# Patient Record
Sex: Female | Born: 1995 | Race: White | Hispanic: No | Marital: Single | Smoking: Never smoker
Health system: Southern US, Community
[De-identification: ages and names within clinical notes are randomized; demographics above are authoritative.]

## PROBLEM LIST (undated history)

## (undated) DIAGNOSIS — B2790 Infectious mononucleosis, unspecified without complication: Secondary | ICD-10-CM

## (undated) HISTORY — PX: KNEE SURGERY: SHX244

## (undated) HISTORY — PX: TONSILLECTOMY: SUR1361

---
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
## 2017-01-15 DIAGNOSIS — R079 Chest pain, unspecified: Secondary | ICD-10-CM | POA: Diagnosis not present

## 2017-02-07 DIAGNOSIS — F413 Other mixed anxiety disorders: Secondary | ICD-10-CM | POA: Diagnosis not present

## 2017-02-25 DIAGNOSIS — R0789 Other chest pain: Secondary | ICD-10-CM | POA: Diagnosis not present

## 2017-02-25 DIAGNOSIS — J069 Acute upper respiratory infection, unspecified: Secondary | ICD-10-CM | POA: Diagnosis not present

## 2017-03-05 DIAGNOSIS — B349 Viral infection, unspecified: Secondary | ICD-10-CM | POA: Diagnosis not present

## 2017-04-24 DIAGNOSIS — R5383 Other fatigue: Secondary | ICD-10-CM | POA: Diagnosis not present

## 2017-04-24 DIAGNOSIS — J309 Allergic rhinitis, unspecified: Secondary | ICD-10-CM | POA: Diagnosis not present

## 2017-04-24 DIAGNOSIS — J329 Chronic sinusitis, unspecified: Secondary | ICD-10-CM | POA: Diagnosis not present

## 2017-06-13 DIAGNOSIS — J309 Allergic rhinitis, unspecified: Secondary | ICD-10-CM | POA: Diagnosis not present

## 2017-06-13 DIAGNOSIS — H109 Unspecified conjunctivitis: Secondary | ICD-10-CM | POA: Diagnosis not present

## 2017-06-13 DIAGNOSIS — J069 Acute upper respiratory infection, unspecified: Secondary | ICD-10-CM | POA: Diagnosis not present

## 2017-07-28 DIAGNOSIS — L700 Acne vulgaris: Secondary | ICD-10-CM | POA: Diagnosis not present

## 2017-09-03 ENCOUNTER — Other Ambulatory Visit: Payer: Self-pay | Admitting: Gastroenterology

## 2017-09-03 DIAGNOSIS — R1010 Upper abdominal pain, unspecified: Secondary | ICD-10-CM | POA: Diagnosis not present

## 2017-09-03 DIAGNOSIS — R140 Abdominal distension (gaseous): Secondary | ICD-10-CM | POA: Diagnosis not present

## 2017-09-03 DIAGNOSIS — R110 Nausea: Secondary | ICD-10-CM | POA: Diagnosis not present

## 2017-09-12 ENCOUNTER — Ambulatory Visit
Admission: RE | Admit: 2017-09-12 | Discharge: 2017-09-12 | Disposition: A | Discharge status: Home / Self Care | Payer: Self-pay | Source: Ambulatory Visit | Attending: Gastroenterology | Admitting: Gastroenterology

## 2017-09-12 DIAGNOSIS — R1010 Upper abdominal pain, unspecified: Secondary | ICD-10-CM

## 2017-10-09 DIAGNOSIS — Z01419 Encounter for gynecological examination (general) (routine) without abnormal findings: Secondary | ICD-10-CM | POA: Diagnosis not present

## 2017-10-09 DIAGNOSIS — Z113 Encounter for screening for infections with a predominantly sexual mode of transmission: Secondary | ICD-10-CM | POA: Diagnosis not present

## 2017-10-09 DIAGNOSIS — N760 Acute vaginitis: Secondary | ICD-10-CM | POA: Diagnosis not present

## 2017-10-09 DIAGNOSIS — Z6824 Body mass index (BMI) 24.0-24.9, adult: Secondary | ICD-10-CM | POA: Diagnosis not present

## 2017-10-15 DIAGNOSIS — R1010 Upper abdominal pain, unspecified: Secondary | ICD-10-CM | POA: Diagnosis not present

## 2017-10-15 DIAGNOSIS — R110 Nausea: Secondary | ICD-10-CM | POA: Diagnosis not present

## 2017-10-15 DIAGNOSIS — R140 Abdominal distension (gaseous): Secondary | ICD-10-CM | POA: Diagnosis not present

## 2017-10-15 DIAGNOSIS — K2930 Chronic superficial gastritis without bleeding: Secondary | ICD-10-CM | POA: Diagnosis not present

## 2017-10-20 DIAGNOSIS — K2930 Chronic superficial gastritis without bleeding: Secondary | ICD-10-CM | POA: Diagnosis not present

## 2017-10-27 ENCOUNTER — Other Ambulatory Visit (HOSPITAL_COMMUNITY): Payer: Self-pay | Admitting: Gastroenterology

## 2017-10-27 DIAGNOSIS — R1084 Generalized abdominal pain: Secondary | ICD-10-CM | POA: Diagnosis not present

## 2017-10-27 DIAGNOSIS — K589 Irritable bowel syndrome without diarrhea: Secondary | ICD-10-CM | POA: Diagnosis not present

## 2017-11-11 ENCOUNTER — Encounter (HOSPITAL_COMMUNITY)
Admission: RE | Admit: 2017-11-11 | Discharge: 2017-11-11 | Disposition: A | Discharge status: Home / Self Care | Payer: BLUE CROSS/BLUE SHIELD | Source: Ambulatory Visit | Attending: Gastroenterology | Admitting: Gastroenterology

## 2017-11-11 DIAGNOSIS — R1084 Generalized abdominal pain: Secondary | ICD-10-CM | POA: Diagnosis not present

## 2017-11-11 DIAGNOSIS — R110 Nausea: Secondary | ICD-10-CM | POA: Diagnosis not present

## 2017-11-11 DIAGNOSIS — R109 Unspecified abdominal pain: Secondary | ICD-10-CM | POA: Diagnosis not present

## 2017-11-11 MED ORDER — TECHNETIUM TC 99M MEBROFENIN IV KIT
5.0000 | PACK | Freq: Once | INTRAVENOUS | Status: AC | PRN
  Administered 2017-11-11: 5 via INTRAVENOUS

## 2017-11-25 DIAGNOSIS — K5900 Constipation, unspecified: Secondary | ICD-10-CM | POA: Diagnosis not present

## 2017-11-25 DIAGNOSIS — K219 Gastro-esophageal reflux disease without esophagitis: Secondary | ICD-10-CM | POA: Diagnosis not present

## 2017-11-25 DIAGNOSIS — K589 Irritable bowel syndrome without diarrhea: Secondary | ICD-10-CM | POA: Diagnosis not present

## 2017-11-25 DIAGNOSIS — K602 Anal fissure, unspecified: Secondary | ICD-10-CM | POA: Diagnosis not present

## 2017-12-12 DIAGNOSIS — H6691 Otitis media, unspecified, right ear: Secondary | ICD-10-CM | POA: Diagnosis not present

## 2018-03-18 DIAGNOSIS — J0101 Acute recurrent maxillary sinusitis: Secondary | ICD-10-CM | POA: Diagnosis not present

## 2018-03-20 DIAGNOSIS — J019 Acute sinusitis, unspecified: Secondary | ICD-10-CM | POA: Diagnosis not present

## 2018-03-20 DIAGNOSIS — R0989 Other specified symptoms and signs involving the circulatory and respiratory systems: Secondary | ICD-10-CM | POA: Diagnosis not present

## 2018-03-20 DIAGNOSIS — R05 Cough: Secondary | ICD-10-CM | POA: Diagnosis not present

## 2018-03-20 DIAGNOSIS — F413 Other mixed anxiety disorders: Secondary | ICD-10-CM | POA: Diagnosis not present

## 2018-03-23 DIAGNOSIS — F413 Other mixed anxiety disorders: Secondary | ICD-10-CM | POA: Diagnosis not present

## 2018-03-30 DIAGNOSIS — F413 Other mixed anxiety disorders: Secondary | ICD-10-CM | POA: Diagnosis not present

## 2018-04-06 DIAGNOSIS — F413 Other mixed anxiety disorders: Secondary | ICD-10-CM | POA: Diagnosis not present

## 2018-04-20 DIAGNOSIS — F413 Other mixed anxiety disorders: Secondary | ICD-10-CM | POA: Diagnosis not present

## 2018-05-08 DIAGNOSIS — F413 Other mixed anxiety disorders: Secondary | ICD-10-CM | POA: Diagnosis not present

## 2018-06-02 DIAGNOSIS — J069 Acute upper respiratory infection, unspecified: Secondary | ICD-10-CM | POA: Diagnosis not present

## 2018-06-19 DIAGNOSIS — F419 Anxiety disorder, unspecified: Secondary | ICD-10-CM | POA: Diagnosis not present

## 2018-08-12 DIAGNOSIS — J029 Acute pharyngitis, unspecified: Secondary | ICD-10-CM | POA: Diagnosis not present

## 2018-09-23 DIAGNOSIS — B078 Other viral warts: Secondary | ICD-10-CM | POA: Diagnosis not present

## 2019-01-08 DIAGNOSIS — Z01419 Encounter for gynecological examination (general) (routine) without abnormal findings: Secondary | ICD-10-CM | POA: Diagnosis not present

## 2019-01-08 DIAGNOSIS — Z6823 Body mass index (BMI) 23.0-23.9, adult: Secondary | ICD-10-CM | POA: Diagnosis not present

## 2019-01-20 ENCOUNTER — Other Ambulatory Visit: Payer: Self-pay

## 2019-01-20 DIAGNOSIS — Z20822 Contact with and (suspected) exposure to covid-19: Secondary | ICD-10-CM

## 2019-01-23 LAB — NOVEL CORONAVIRUS, NAA: SARS-CoV-2, NAA: NOT DETECTED

## 2019-03-29 DIAGNOSIS — U071 COVID-19: Secondary | ICD-10-CM | POA: Diagnosis not present

## 2019-03-29 DIAGNOSIS — R5383 Other fatigue: Secondary | ICD-10-CM | POA: Diagnosis not present

## 2019-04-05 DIAGNOSIS — R7309 Other abnormal glucose: Secondary | ICD-10-CM | POA: Diagnosis not present

## 2019-04-05 DIAGNOSIS — R5383 Other fatigue: Secondary | ICD-10-CM | POA: Diagnosis not present

## 2019-04-08 DIAGNOSIS — N939 Abnormal uterine and vaginal bleeding, unspecified: Secondary | ICD-10-CM | POA: Diagnosis not present

## 2019-04-08 DIAGNOSIS — Z113 Encounter for screening for infections with a predominantly sexual mode of transmission: Secondary | ICD-10-CM | POA: Diagnosis not present

## 2019-04-20 DIAGNOSIS — N941 Unspecified dyspareunia: Secondary | ICD-10-CM | POA: Diagnosis not present

## 2019-04-20 DIAGNOSIS — N760 Acute vaginitis: Secondary | ICD-10-CM | POA: Diagnosis not present

## 2019-04-20 DIAGNOSIS — Z113 Encounter for screening for infections with a predominantly sexual mode of transmission: Secondary | ICD-10-CM | POA: Diagnosis not present

## 2019-04-20 DIAGNOSIS — N921 Excessive and frequent menstruation with irregular cycle: Secondary | ICD-10-CM | POA: Diagnosis not present

## 2019-05-27 DIAGNOSIS — H109 Unspecified conjunctivitis: Secondary | ICD-10-CM | POA: Diagnosis not present

## 2019-05-27 DIAGNOSIS — H5789 Other specified disorders of eye and adnexa: Secondary | ICD-10-CM | POA: Diagnosis not present

## 2019-07-12 DIAGNOSIS — R7309 Other abnormal glucose: Secondary | ICD-10-CM | POA: Diagnosis not present

## 2019-08-10 DIAGNOSIS — J309 Allergic rhinitis, unspecified: Secondary | ICD-10-CM | POA: Diagnosis not present

## 2019-12-14 IMAGING — US US ABDOMEN LIMITED
1 series · 14 of 25 positions shown · non-contrast
Comparison: None.

CLINICAL DATA: Upper abdominal pain.  Pain after eating.

EXAM:
ULTRASOUND ABDOMEN LIMITED RIGHT UPPER QUADRANT

[Series 1: us abdomen limited · 0.17mm/px · 14 of 44 slices shown]
[im 1/44]
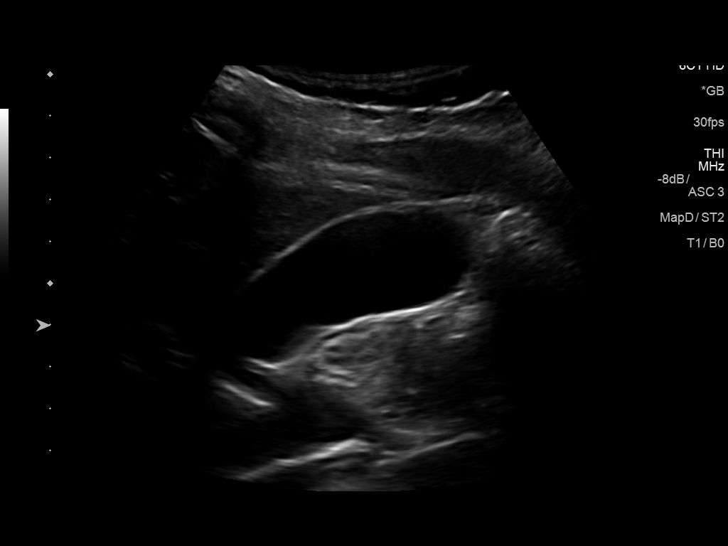
[im 4/44]
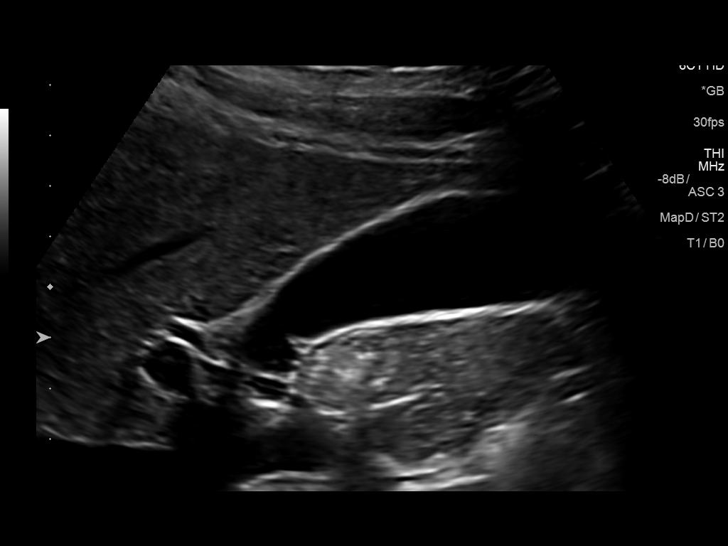
[im 8/44]
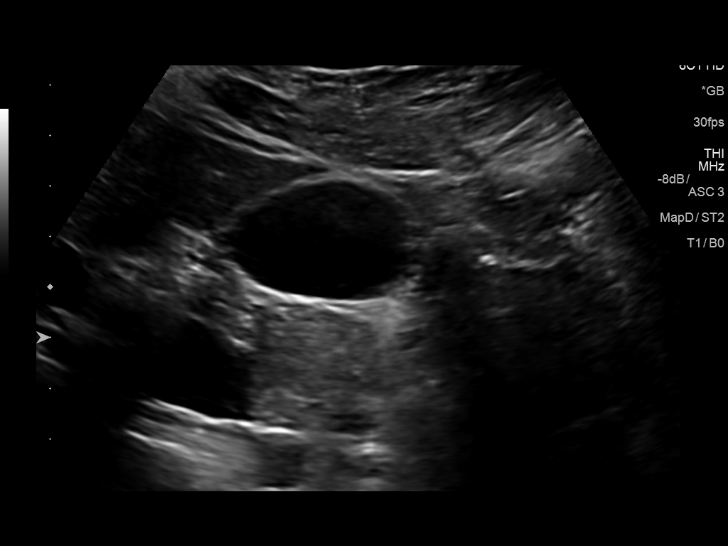
[im 11/44]
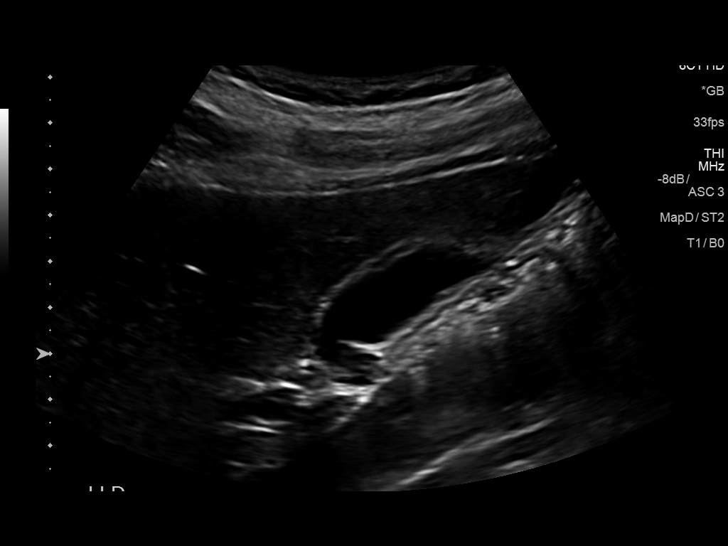
[im 15/44]
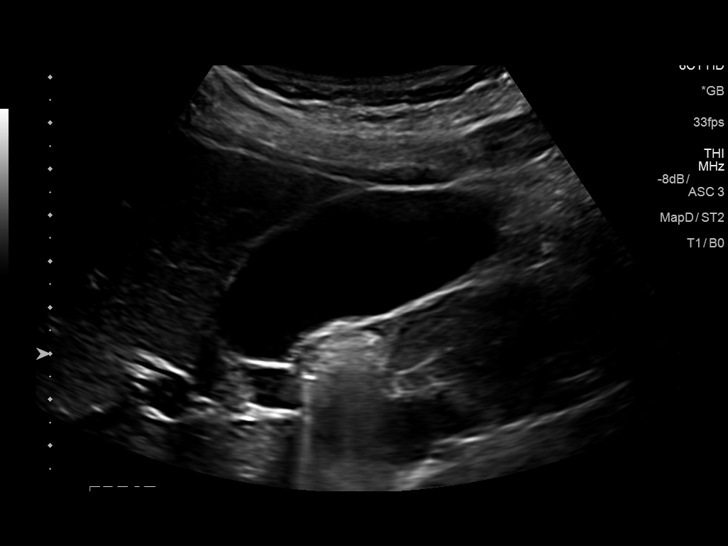
[im 17/44]
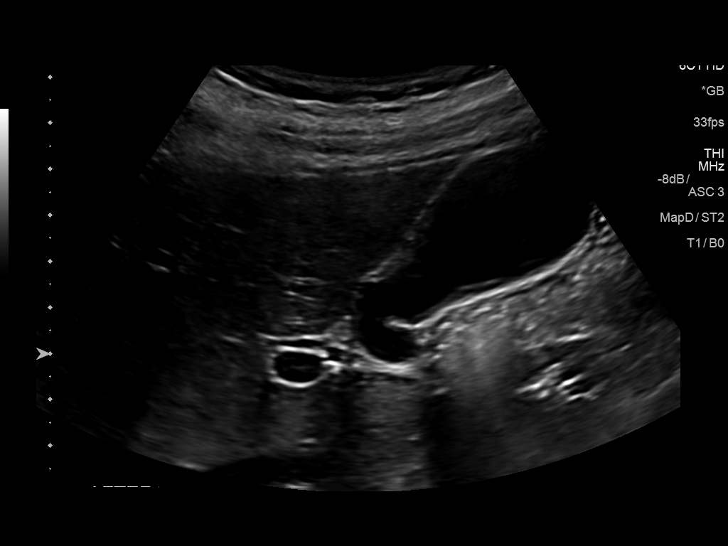
[im 20/44]
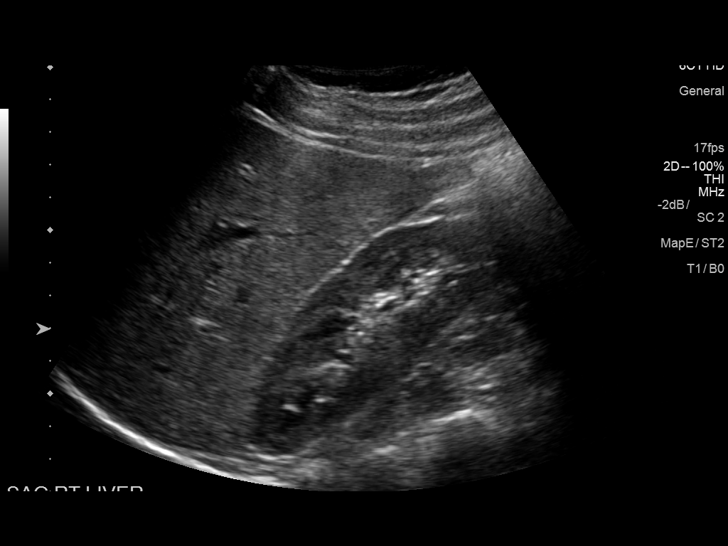
[im 24/44]
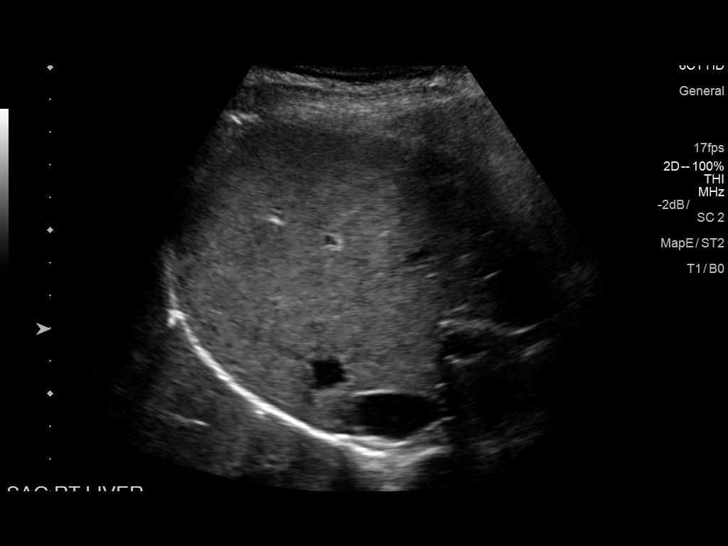
[im 27/44]
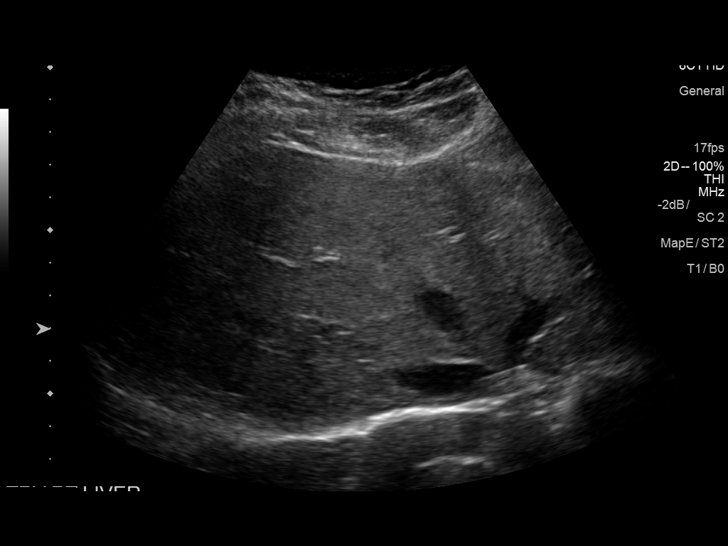
[im 29/44]
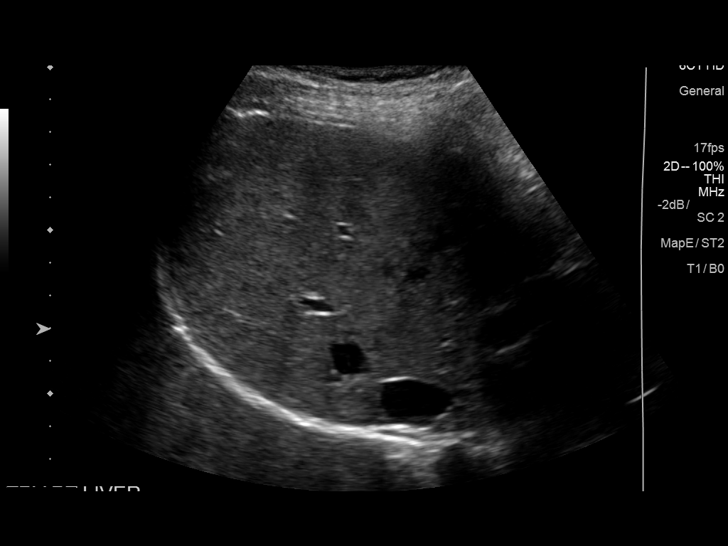
[im 33/44]
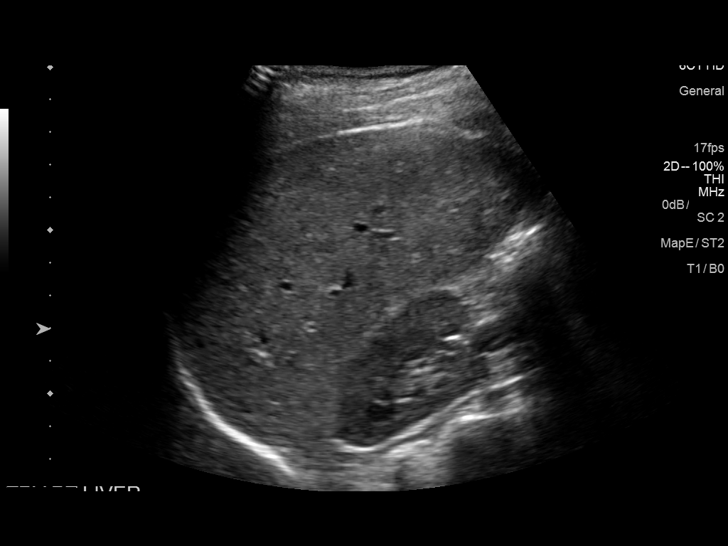
[im 36/44]
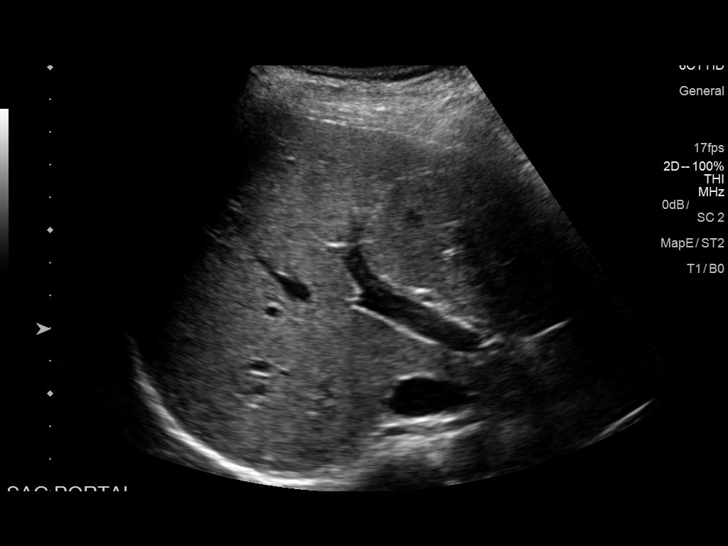
[im 40/44]
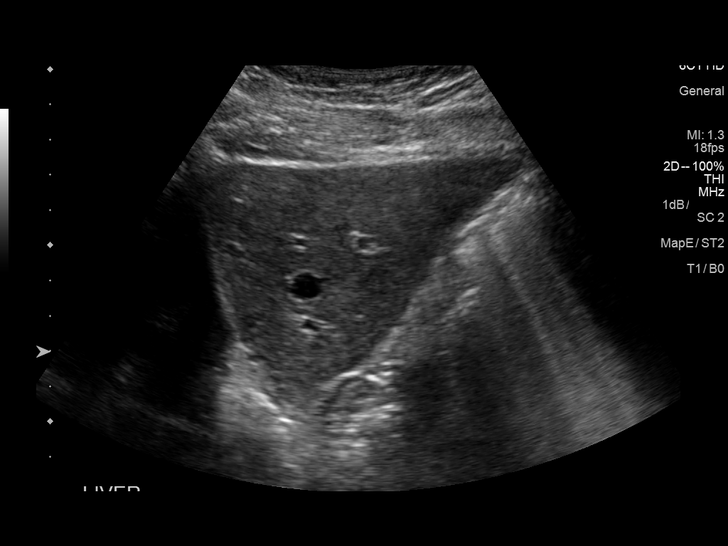
[im 44/44]
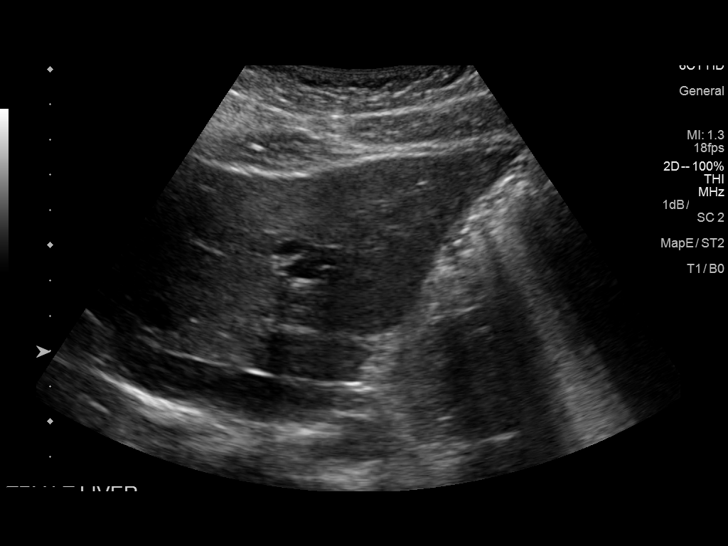

[14 of 25 positions shown; findings below may reference images not displayed]

FINDINGS: Gallbladder:

No gallstones or wall thickening visualized. No sonographic Murphy
sign noted by sonographer.

Common bile duct:

Diameter: 1.9 mm

Liver:

No focal lesion identified. Within normal limits in parenchymal
echogenicity. Portal vein is patent on color Doppler imaging with
normal direction of blood flow towards the liver.
IMPRESSION: Normal right upper quadrant ultrasound.

## 2020-05-31 IMAGING — NM NM HEPATO W/GB/PHARM/[PERSON_NAME]
3 series · 13 of 13 positions shown · non-contrast
Comparison: None.

CLINICAL DATA: Abdominal pain and nausea

EXAM:
NUCLEAR MEDICINE HEPATOBILIARY IMAGING WITH GALLBLADDER EF
VIEWS:
Anterior, right lateral right upper quadrant
RADIOPHARMACEUTICALS:  5.0 mCi Xc-HHm  Choletec IV

[Series 1: biliary · 4.14mm/px · 6 of 60 frames shown]
[frame 6/60]
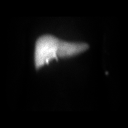
[frame 16/60]
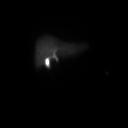
[frame 26/60]
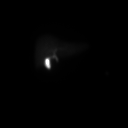
[frame 36/60]
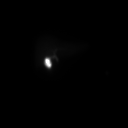
[frame 46/60]
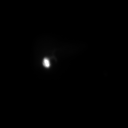
[frame 56/60]
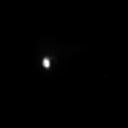

[Series 2: rt lat · 4.14mm/px · 1 of 1 slices shown]
[im 1/1]
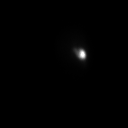

[Series 3: gbef · 4.14mm/px · 6 of 60 frames shown]
[frame 6/60]
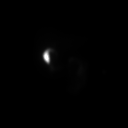
[frame 16/60]
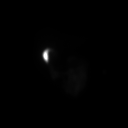
[frame 26/60]
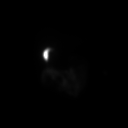
[frame 36/60]
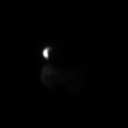
[frame 46/60]
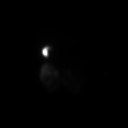
[frame 56/60]
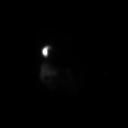

[13 of 13 positions shown; findings below may reference images not displayed]

FINDINGS: Liver uptake of radiotracer is normal. There is prompt visualization
of gallbladder and small bowel, indicating patency of the cystic and
common bile ducts. The patient consumed 8 ounces of Ensure Enlive
orally with calculation of the computer generated ejection fraction
of radiotracer from the gallbladder. The patient did not experience
clinical symptoms with the oral Ensure consumption. The ejection
fraction of radiotracer from the gallbladder is normal at 74%,
normal greater than 33% using the oral agent.
IMPRESSION: Study within normal limits.

## 2020-11-22 DIAGNOSIS — R059 Cough, unspecified: Secondary | ICD-10-CM | POA: Diagnosis not present

## 2020-11-22 DIAGNOSIS — R0981 Nasal congestion: Secondary | ICD-10-CM | POA: Diagnosis not present

## 2020-11-22 DIAGNOSIS — J029 Acute pharyngitis, unspecified: Secondary | ICD-10-CM | POA: Diagnosis not present

## 2020-11-22 DIAGNOSIS — Z20822 Contact with and (suspected) exposure to covid-19: Secondary | ICD-10-CM | POA: Diagnosis not present

## 2020-11-22 DIAGNOSIS — R5383 Other fatigue: Secondary | ICD-10-CM | POA: Diagnosis not present

## 2020-12-29 DIAGNOSIS — L918 Other hypertrophic disorders of the skin: Secondary | ICD-10-CM | POA: Diagnosis not present

## 2020-12-29 DIAGNOSIS — D225 Melanocytic nevi of trunk: Secondary | ICD-10-CM | POA: Diagnosis not present

## 2020-12-29 DIAGNOSIS — B078 Other viral warts: Secondary | ICD-10-CM | POA: Diagnosis not present

## 2020-12-29 DIAGNOSIS — Z1283 Encounter for screening for malignant neoplasm of skin: Secondary | ICD-10-CM | POA: Diagnosis not present

## 2020-12-29 DIAGNOSIS — D485 Neoplasm of uncertain behavior of skin: Secondary | ICD-10-CM | POA: Diagnosis not present

## 2021-01-17 DIAGNOSIS — L98429 Non-pressure chronic ulcer of back with unspecified severity: Secondary | ICD-10-CM | POA: Diagnosis not present

## 2021-01-17 DIAGNOSIS — D485 Neoplasm of uncertain behavior of skin: Secondary | ICD-10-CM | POA: Diagnosis not present

## 2021-01-30 DIAGNOSIS — D485 Neoplasm of uncertain behavior of skin: Secondary | ICD-10-CM | POA: Diagnosis not present

## 2021-02-07 DIAGNOSIS — Z20822 Contact with and (suspected) exposure to covid-19: Secondary | ICD-10-CM | POA: Diagnosis not present

## 2021-02-07 DIAGNOSIS — B349 Viral infection, unspecified: Secondary | ICD-10-CM | POA: Diagnosis not present

## 2021-02-07 DIAGNOSIS — R0982 Postnasal drip: Secondary | ICD-10-CM | POA: Diagnosis not present

## 2021-02-07 DIAGNOSIS — R0981 Nasal congestion: Secondary | ICD-10-CM | POA: Diagnosis not present

## 2021-02-07 DIAGNOSIS — R051 Acute cough: Secondary | ICD-10-CM | POA: Diagnosis not present

## 2021-02-15 DIAGNOSIS — Z309 Encounter for contraceptive management, unspecified: Secondary | ICD-10-CM | POA: Diagnosis not present

## 2021-02-15 DIAGNOSIS — Z113 Encounter for screening for infections with a predominantly sexual mode of transmission: Secondary | ICD-10-CM | POA: Diagnosis not present

## 2021-02-15 DIAGNOSIS — Z6821 Body mass index (BMI) 21.0-21.9, adult: Secondary | ICD-10-CM | POA: Diagnosis not present

## 2021-02-15 DIAGNOSIS — Z01419 Encounter for gynecological examination (general) (routine) without abnormal findings: Secondary | ICD-10-CM | POA: Diagnosis not present

## 2021-03-06 DIAGNOSIS — R52 Pain, unspecified: Secondary | ICD-10-CM | POA: Diagnosis not present

## 2021-03-06 DIAGNOSIS — R509 Fever, unspecified: Secondary | ICD-10-CM | POA: Diagnosis not present

## 2021-03-06 DIAGNOSIS — U071 COVID-19: Secondary | ICD-10-CM | POA: Diagnosis not present

## 2021-03-06 DIAGNOSIS — R051 Acute cough: Secondary | ICD-10-CM | POA: Diagnosis not present

## 2021-03-06 DIAGNOSIS — Z20822 Contact with and (suspected) exposure to covid-19: Secondary | ICD-10-CM | POA: Diagnosis not present

## 2021-05-18 DIAGNOSIS — D225 Melanocytic nevi of trunk: Secondary | ICD-10-CM | POA: Diagnosis not present

## 2021-05-18 DIAGNOSIS — Z1283 Encounter for screening for malignant neoplasm of skin: Secondary | ICD-10-CM | POA: Diagnosis not present

## 2021-05-18 DIAGNOSIS — D2271 Melanocytic nevi of right lower limb, including hip: Secondary | ICD-10-CM | POA: Diagnosis not present

## 2021-05-18 DIAGNOSIS — B078 Other viral warts: Secondary | ICD-10-CM | POA: Diagnosis not present

## 2021-05-18 DIAGNOSIS — D485 Neoplasm of uncertain behavior of skin: Secondary | ICD-10-CM | POA: Diagnosis not present

## 2021-06-05 DIAGNOSIS — D485 Neoplasm of uncertain behavior of skin: Secondary | ICD-10-CM | POA: Diagnosis not present

## 2021-06-05 DIAGNOSIS — L98499 Non-pressure chronic ulcer of skin of other sites with unspecified severity: Secondary | ICD-10-CM | POA: Diagnosis not present

## 2021-06-26 DIAGNOSIS — Z6823 Body mass index (BMI) 23.0-23.9, adult: Secondary | ICD-10-CM | POA: Diagnosis not present

## 2021-06-26 DIAGNOSIS — R635 Abnormal weight gain: Secondary | ICD-10-CM | POA: Diagnosis not present

## 2021-06-26 DIAGNOSIS — N760 Acute vaginitis: Secondary | ICD-10-CM | POA: Diagnosis not present

## 2021-06-26 DIAGNOSIS — Z309 Encounter for contraceptive management, unspecified: Secondary | ICD-10-CM | POA: Diagnosis not present

## 2021-10-02 DIAGNOSIS — Z1283 Encounter for screening for malignant neoplasm of skin: Secondary | ICD-10-CM | POA: Diagnosis not present

## 2021-10-02 DIAGNOSIS — B078 Other viral warts: Secondary | ICD-10-CM | POA: Diagnosis not present

## 2021-10-02 DIAGNOSIS — L905 Scar conditions and fibrosis of skin: Secondary | ICD-10-CM | POA: Diagnosis not present

## 2021-10-02 DIAGNOSIS — D225 Melanocytic nevi of trunk: Secondary | ICD-10-CM | POA: Diagnosis not present

## 2021-11-10 DIAGNOSIS — L03011 Cellulitis of right finger: Secondary | ICD-10-CM | POA: Diagnosis not present

## 2021-12-26 DIAGNOSIS — Z01419 Encounter for gynecological examination (general) (routine) without abnormal findings: Secondary | ICD-10-CM | POA: Diagnosis not present

## 2021-12-26 DIAGNOSIS — Z1231 Encounter for screening mammogram for malignant neoplasm of breast: Secondary | ICD-10-CM | POA: Diagnosis not present

## 2021-12-26 DIAGNOSIS — Z6822 Body mass index (BMI) 22.0-22.9, adult: Secondary | ICD-10-CM | POA: Diagnosis not present

## 2021-12-26 DIAGNOSIS — Z124 Encounter for screening for malignant neoplasm of cervix: Secondary | ICD-10-CM | POA: Diagnosis not present

## 2022-01-29 DIAGNOSIS — D225 Melanocytic nevi of trunk: Secondary | ICD-10-CM | POA: Diagnosis not present

## 2022-01-29 DIAGNOSIS — Z1283 Encounter for screening for malignant neoplasm of skin: Secondary | ICD-10-CM | POA: Diagnosis not present

## 2022-03-08 DIAGNOSIS — D2371 Other benign neoplasm of skin of right lower limb, including hip: Secondary | ICD-10-CM | POA: Diagnosis not present

## 2022-03-08 DIAGNOSIS — M21961 Unspecified acquired deformity of right lower leg: Secondary | ICD-10-CM | POA: Diagnosis not present

## 2022-03-08 DIAGNOSIS — S86111A Strain of other muscle(s) and tendon(s) of posterior muscle group at lower leg level, right leg, initial encounter: Secondary | ICD-10-CM | POA: Diagnosis not present

## 2022-03-28 DIAGNOSIS — D2371 Other benign neoplasm of skin of right lower limb, including hip: Secondary | ICD-10-CM | POA: Diagnosis not present

## 2022-04-11 DIAGNOSIS — S86111A Strain of other muscle(s) and tendon(s) of posterior muscle group at lower leg level, right leg, initial encounter: Secondary | ICD-10-CM | POA: Diagnosis not present

## 2022-07-30 DIAGNOSIS — D225 Melanocytic nevi of trunk: Secondary | ICD-10-CM | POA: Diagnosis not present

## 2022-07-30 DIAGNOSIS — L738 Other specified follicular disorders: Secondary | ICD-10-CM | POA: Diagnosis not present

## 2022-07-30 DIAGNOSIS — L905 Scar conditions and fibrosis of skin: Secondary | ICD-10-CM | POA: Diagnosis not present

## 2022-07-30 DIAGNOSIS — Z1283 Encounter for screening for malignant neoplasm of skin: Secondary | ICD-10-CM | POA: Diagnosis not present

## 2022-10-21 DIAGNOSIS — Z0000 Encounter for general adult medical examination without abnormal findings: Secondary | ICD-10-CM | POA: Diagnosis not present

## 2022-10-23 DIAGNOSIS — R7309 Other abnormal glucose: Secondary | ICD-10-CM | POA: Diagnosis not present

## 2022-10-23 DIAGNOSIS — Z0000 Encounter for general adult medical examination without abnormal findings: Secondary | ICD-10-CM | POA: Diagnosis not present

## 2022-10-23 DIAGNOSIS — Z13220 Encounter for screening for lipoid disorders: Secondary | ICD-10-CM | POA: Diagnosis not present

## 2022-10-23 DIAGNOSIS — Z130 Encounter for screening for diseases of the blood and blood-forming organs and certain disorders involving the immune mechanism: Secondary | ICD-10-CM | POA: Diagnosis not present

## 2022-10-25 DIAGNOSIS — F413 Other mixed anxiety disorders: Secondary | ICD-10-CM | POA: Diagnosis not present

## 2022-11-07 DIAGNOSIS — F413 Other mixed anxiety disorders: Secondary | ICD-10-CM | POA: Diagnosis not present

## 2022-12-31 DIAGNOSIS — Z113 Encounter for screening for infections with a predominantly sexual mode of transmission: Secondary | ICD-10-CM | POA: Diagnosis not present

## 2022-12-31 DIAGNOSIS — Z6822 Body mass index (BMI) 22.0-22.9, adult: Secondary | ICD-10-CM | POA: Diagnosis not present

## 2022-12-31 DIAGNOSIS — Z01419 Encounter for gynecological examination (general) (routine) without abnormal findings: Secondary | ICD-10-CM | POA: Diagnosis not present

## 2023-01-06 DIAGNOSIS — R599 Enlarged lymph nodes, unspecified: Secondary | ICD-10-CM | POA: Diagnosis not present

## 2023-01-09 DIAGNOSIS — R599 Enlarged lymph nodes, unspecified: Secondary | ICD-10-CM | POA: Diagnosis not present
